# Patient Record
Sex: Male | Born: 1960
Health system: Southern US, Community
[De-identification: ages and names within clinical notes are randomized; demographics above are authoritative.]

## PROBLEM LIST (undated history)

## (undated) HISTORY — PX: COSMETIC SURGERY: SHX468

---

## 2001-05-23 ENCOUNTER — Ambulatory Visit (HOSPITAL_COMMUNITY): Admission: RE | Admit: 2001-05-23 | Discharge: 2001-05-23 | Payer: Self-pay | Admitting: General Surgery

## 2006-03-30 ENCOUNTER — Emergency Department (HOSPITAL_COMMUNITY): Admission: EM | Admit: 2006-03-30 | Discharge: 2006-03-31 | Payer: Self-pay | Admitting: Emergency Medicine

## 2006-04-02 ENCOUNTER — Ambulatory Visit (HOSPITAL_COMMUNITY): Admission: RE | Admit: 2006-04-02 | Discharge: 2006-04-02 | Payer: Self-pay | Admitting: *Deleted

## 2016-08-22 DIAGNOSIS — Z125 Encounter for screening for malignant neoplasm of prostate: Secondary | ICD-10-CM | POA: Diagnosis not present

## 2016-08-22 DIAGNOSIS — E7800 Pure hypercholesterolemia, unspecified: Secondary | ICD-10-CM | POA: Diagnosis not present

## 2016-08-22 DIAGNOSIS — Z0000 Encounter for general adult medical examination without abnormal findings: Secondary | ICD-10-CM | POA: Diagnosis not present

## 2016-08-22 DIAGNOSIS — Z79899 Other long term (current) drug therapy: Secondary | ICD-10-CM | POA: Diagnosis not present

## 2016-08-22 DIAGNOSIS — Z23 Encounter for immunization: Secondary | ICD-10-CM | POA: Diagnosis not present

## 2016-08-22 DIAGNOSIS — R5383 Other fatigue: Secondary | ICD-10-CM | POA: Diagnosis not present

## 2017-01-03 DIAGNOSIS — M9903 Segmental and somatic dysfunction of lumbar region: Secondary | ICD-10-CM | POA: Diagnosis not present

## 2017-01-07 DIAGNOSIS — M9903 Segmental and somatic dysfunction of lumbar region: Secondary | ICD-10-CM | POA: Diagnosis not present

## 2017-01-07 DIAGNOSIS — M461 Sacroiliitis, not elsewhere classified: Secondary | ICD-10-CM | POA: Diagnosis not present

## 2017-01-07 DIAGNOSIS — M5137 Other intervertebral disc degeneration, lumbosacral region: Secondary | ICD-10-CM | POA: Diagnosis not present

## 2017-01-07 DIAGNOSIS — M9905 Segmental and somatic dysfunction of pelvic region: Secondary | ICD-10-CM | POA: Diagnosis not present

## 2017-01-08 DIAGNOSIS — M9903 Segmental and somatic dysfunction of lumbar region: Secondary | ICD-10-CM | POA: Diagnosis not present

## 2017-01-08 DIAGNOSIS — M9905 Segmental and somatic dysfunction of pelvic region: Secondary | ICD-10-CM | POA: Diagnosis not present

## 2017-01-08 DIAGNOSIS — M461 Sacroiliitis, not elsewhere classified: Secondary | ICD-10-CM | POA: Diagnosis not present

## 2017-01-08 DIAGNOSIS — M5137 Other intervertebral disc degeneration, lumbosacral region: Secondary | ICD-10-CM | POA: Diagnosis not present

## 2017-01-10 DIAGNOSIS — M461 Sacroiliitis, not elsewhere classified: Secondary | ICD-10-CM | POA: Diagnosis not present

## 2017-01-10 DIAGNOSIS — M5137 Other intervertebral disc degeneration, lumbosacral region: Secondary | ICD-10-CM | POA: Diagnosis not present

## 2017-01-10 DIAGNOSIS — M9903 Segmental and somatic dysfunction of lumbar region: Secondary | ICD-10-CM | POA: Diagnosis not present

## 2017-01-10 DIAGNOSIS — M9905 Segmental and somatic dysfunction of pelvic region: Secondary | ICD-10-CM | POA: Diagnosis not present

## 2017-01-14 DIAGNOSIS — M461 Sacroiliitis, not elsewhere classified: Secondary | ICD-10-CM | POA: Diagnosis not present

## 2017-01-14 DIAGNOSIS — M5137 Other intervertebral disc degeneration, lumbosacral region: Secondary | ICD-10-CM | POA: Diagnosis not present

## 2017-01-14 DIAGNOSIS — M9903 Segmental and somatic dysfunction of lumbar region: Secondary | ICD-10-CM | POA: Diagnosis not present

## 2017-01-14 DIAGNOSIS — M9905 Segmental and somatic dysfunction of pelvic region: Secondary | ICD-10-CM | POA: Diagnosis not present

## 2017-01-16 DIAGNOSIS — M9903 Segmental and somatic dysfunction of lumbar region: Secondary | ICD-10-CM | POA: Diagnosis not present

## 2017-01-16 DIAGNOSIS — M9905 Segmental and somatic dysfunction of pelvic region: Secondary | ICD-10-CM | POA: Diagnosis not present

## 2017-01-16 DIAGNOSIS — M461 Sacroiliitis, not elsewhere classified: Secondary | ICD-10-CM | POA: Diagnosis not present

## 2017-01-16 DIAGNOSIS — M5137 Other intervertebral disc degeneration, lumbosacral region: Secondary | ICD-10-CM | POA: Diagnosis not present

## 2017-01-17 DIAGNOSIS — M461 Sacroiliitis, not elsewhere classified: Secondary | ICD-10-CM | POA: Diagnosis not present

## 2017-01-17 DIAGNOSIS — M5137 Other intervertebral disc degeneration, lumbosacral region: Secondary | ICD-10-CM | POA: Diagnosis not present

## 2017-01-17 DIAGNOSIS — M9903 Segmental and somatic dysfunction of lumbar region: Secondary | ICD-10-CM | POA: Diagnosis not present

## 2017-01-17 DIAGNOSIS — M9905 Segmental and somatic dysfunction of pelvic region: Secondary | ICD-10-CM | POA: Diagnosis not present

## 2017-01-22 DIAGNOSIS — M461 Sacroiliitis, not elsewhere classified: Secondary | ICD-10-CM | POA: Diagnosis not present

## 2017-01-22 DIAGNOSIS — M9903 Segmental and somatic dysfunction of lumbar region: Secondary | ICD-10-CM | POA: Diagnosis not present

## 2017-01-22 DIAGNOSIS — M5137 Other intervertebral disc degeneration, lumbosacral region: Secondary | ICD-10-CM | POA: Diagnosis not present

## 2017-01-22 DIAGNOSIS — M9905 Segmental and somatic dysfunction of pelvic region: Secondary | ICD-10-CM | POA: Diagnosis not present

## 2017-01-24 DIAGNOSIS — M9905 Segmental and somatic dysfunction of pelvic region: Secondary | ICD-10-CM | POA: Diagnosis not present

## 2017-01-24 DIAGNOSIS — M5137 Other intervertebral disc degeneration, lumbosacral region: Secondary | ICD-10-CM | POA: Diagnosis not present

## 2017-01-24 DIAGNOSIS — M461 Sacroiliitis, not elsewhere classified: Secondary | ICD-10-CM | POA: Diagnosis not present

## 2017-01-24 DIAGNOSIS — M9903 Segmental and somatic dysfunction of lumbar region: Secondary | ICD-10-CM | POA: Diagnosis not present

## 2017-01-29 DIAGNOSIS — M9905 Segmental and somatic dysfunction of pelvic region: Secondary | ICD-10-CM | POA: Diagnosis not present

## 2017-01-29 DIAGNOSIS — M9903 Segmental and somatic dysfunction of lumbar region: Secondary | ICD-10-CM | POA: Diagnosis not present

## 2017-01-29 DIAGNOSIS — M5137 Other intervertebral disc degeneration, lumbosacral region: Secondary | ICD-10-CM | POA: Diagnosis not present

## 2017-01-29 DIAGNOSIS — M461 Sacroiliitis, not elsewhere classified: Secondary | ICD-10-CM | POA: Diagnosis not present

## 2017-01-31 DIAGNOSIS — M5137 Other intervertebral disc degeneration, lumbosacral region: Secondary | ICD-10-CM | POA: Diagnosis not present

## 2017-01-31 DIAGNOSIS — M9905 Segmental and somatic dysfunction of pelvic region: Secondary | ICD-10-CM | POA: Diagnosis not present

## 2017-01-31 DIAGNOSIS — M461 Sacroiliitis, not elsewhere classified: Secondary | ICD-10-CM | POA: Diagnosis not present

## 2017-01-31 DIAGNOSIS — M9903 Segmental and somatic dysfunction of lumbar region: Secondary | ICD-10-CM | POA: Diagnosis not present

## 2017-02-05 DIAGNOSIS — M9905 Segmental and somatic dysfunction of pelvic region: Secondary | ICD-10-CM | POA: Diagnosis not present

## 2017-02-05 DIAGNOSIS — M9903 Segmental and somatic dysfunction of lumbar region: Secondary | ICD-10-CM | POA: Diagnosis not present

## 2017-02-05 DIAGNOSIS — M5137 Other intervertebral disc degeneration, lumbosacral region: Secondary | ICD-10-CM | POA: Diagnosis not present

## 2017-02-05 DIAGNOSIS — M461 Sacroiliitis, not elsewhere classified: Secondary | ICD-10-CM | POA: Diagnosis not present

## 2017-02-07 DIAGNOSIS — M9903 Segmental and somatic dysfunction of lumbar region: Secondary | ICD-10-CM | POA: Diagnosis not present

## 2017-02-07 DIAGNOSIS — M461 Sacroiliitis, not elsewhere classified: Secondary | ICD-10-CM | POA: Diagnosis not present

## 2017-02-07 DIAGNOSIS — M5137 Other intervertebral disc degeneration, lumbosacral region: Secondary | ICD-10-CM | POA: Diagnosis not present

## 2017-02-07 DIAGNOSIS — M9905 Segmental and somatic dysfunction of pelvic region: Secondary | ICD-10-CM | POA: Diagnosis not present

## 2017-02-12 DIAGNOSIS — M9903 Segmental and somatic dysfunction of lumbar region: Secondary | ICD-10-CM | POA: Diagnosis not present

## 2017-02-12 DIAGNOSIS — M461 Sacroiliitis, not elsewhere classified: Secondary | ICD-10-CM | POA: Diagnosis not present

## 2017-02-12 DIAGNOSIS — M5137 Other intervertebral disc degeneration, lumbosacral region: Secondary | ICD-10-CM | POA: Diagnosis not present

## 2017-02-12 DIAGNOSIS — M9905 Segmental and somatic dysfunction of pelvic region: Secondary | ICD-10-CM | POA: Diagnosis not present

## 2017-02-14 DIAGNOSIS — M461 Sacroiliitis, not elsewhere classified: Secondary | ICD-10-CM | POA: Diagnosis not present

## 2017-02-14 DIAGNOSIS — M9905 Segmental and somatic dysfunction of pelvic region: Secondary | ICD-10-CM | POA: Diagnosis not present

## 2017-02-14 DIAGNOSIS — M9903 Segmental and somatic dysfunction of lumbar region: Secondary | ICD-10-CM | POA: Diagnosis not present

## 2017-02-14 DIAGNOSIS — M5137 Other intervertebral disc degeneration, lumbosacral region: Secondary | ICD-10-CM | POA: Diagnosis not present

## 2017-02-19 DIAGNOSIS — M9903 Segmental and somatic dysfunction of lumbar region: Secondary | ICD-10-CM | POA: Diagnosis not present

## 2017-02-19 DIAGNOSIS — M461 Sacroiliitis, not elsewhere classified: Secondary | ICD-10-CM | POA: Diagnosis not present

## 2017-02-19 DIAGNOSIS — M9905 Segmental and somatic dysfunction of pelvic region: Secondary | ICD-10-CM | POA: Diagnosis not present

## 2017-02-19 DIAGNOSIS — M5137 Other intervertebral disc degeneration, lumbosacral region: Secondary | ICD-10-CM | POA: Diagnosis not present

## 2017-02-21 DIAGNOSIS — M9903 Segmental and somatic dysfunction of lumbar region: Secondary | ICD-10-CM | POA: Diagnosis not present

## 2017-02-21 DIAGNOSIS — M9905 Segmental and somatic dysfunction of pelvic region: Secondary | ICD-10-CM | POA: Diagnosis not present

## 2017-02-21 DIAGNOSIS — M5137 Other intervertebral disc degeneration, lumbosacral region: Secondary | ICD-10-CM | POA: Diagnosis not present

## 2017-02-21 DIAGNOSIS — M461 Sacroiliitis, not elsewhere classified: Secondary | ICD-10-CM | POA: Diagnosis not present

## 2017-02-26 DIAGNOSIS — M5137 Other intervertebral disc degeneration, lumbosacral region: Secondary | ICD-10-CM | POA: Diagnosis not present

## 2017-02-26 DIAGNOSIS — M9903 Segmental and somatic dysfunction of lumbar region: Secondary | ICD-10-CM | POA: Diagnosis not present

## 2017-02-26 DIAGNOSIS — M9905 Segmental and somatic dysfunction of pelvic region: Secondary | ICD-10-CM | POA: Diagnosis not present

## 2017-02-26 DIAGNOSIS — M461 Sacroiliitis, not elsewhere classified: Secondary | ICD-10-CM | POA: Diagnosis not present

## 2017-03-05 DIAGNOSIS — M9903 Segmental and somatic dysfunction of lumbar region: Secondary | ICD-10-CM | POA: Diagnosis not present

## 2017-03-05 DIAGNOSIS — M9905 Segmental and somatic dysfunction of pelvic region: Secondary | ICD-10-CM | POA: Diagnosis not present

## 2017-03-05 DIAGNOSIS — M461 Sacroiliitis, not elsewhere classified: Secondary | ICD-10-CM | POA: Diagnosis not present

## 2017-03-05 DIAGNOSIS — M5137 Other intervertebral disc degeneration, lumbosacral region: Secondary | ICD-10-CM | POA: Diagnosis not present

## 2017-03-12 DIAGNOSIS — M9905 Segmental and somatic dysfunction of pelvic region: Secondary | ICD-10-CM | POA: Diagnosis not present

## 2017-03-12 DIAGNOSIS — M5137 Other intervertebral disc degeneration, lumbosacral region: Secondary | ICD-10-CM | POA: Diagnosis not present

## 2017-03-12 DIAGNOSIS — M9903 Segmental and somatic dysfunction of lumbar region: Secondary | ICD-10-CM | POA: Diagnosis not present

## 2017-03-12 DIAGNOSIS — M461 Sacroiliitis, not elsewhere classified: Secondary | ICD-10-CM | POA: Diagnosis not present

## 2017-03-25 DIAGNOSIS — N501 Vascular disorders of male genital organs: Secondary | ICD-10-CM | POA: Diagnosis not present

## 2017-03-25 DIAGNOSIS — N486 Induration penis plastica: Secondary | ICD-10-CM | POA: Diagnosis not present

## 2017-03-26 DIAGNOSIS — M5137 Other intervertebral disc degeneration, lumbosacral region: Secondary | ICD-10-CM | POA: Diagnosis not present

## 2017-03-26 DIAGNOSIS — M461 Sacroiliitis, not elsewhere classified: Secondary | ICD-10-CM | POA: Diagnosis not present

## 2017-03-26 DIAGNOSIS — M9903 Segmental and somatic dysfunction of lumbar region: Secondary | ICD-10-CM | POA: Diagnosis not present

## 2017-03-26 DIAGNOSIS — M9905 Segmental and somatic dysfunction of pelvic region: Secondary | ICD-10-CM | POA: Diagnosis not present

## 2017-09-12 DIAGNOSIS — Z13220 Encounter for screening for lipoid disorders: Secondary | ICD-10-CM | POA: Diagnosis not present

## 2017-09-12 DIAGNOSIS — Z0000 Encounter for general adult medical examination without abnormal findings: Secondary | ICD-10-CM | POA: Diagnosis not present

## 2017-09-12 DIAGNOSIS — Z131 Encounter for screening for diabetes mellitus: Secondary | ICD-10-CM | POA: Diagnosis not present

## 2017-09-12 DIAGNOSIS — Z125 Encounter for screening for malignant neoplasm of prostate: Secondary | ICD-10-CM | POA: Diagnosis not present

## 2017-09-12 DIAGNOSIS — Z23 Encounter for immunization: Secondary | ICD-10-CM | POA: Diagnosis not present

## 2017-09-27 DIAGNOSIS — R7301 Impaired fasting glucose: Secondary | ICD-10-CM | POA: Diagnosis not present

## 2017-11-21 DIAGNOSIS — M545 Low back pain: Secondary | ICD-10-CM | POA: Diagnosis not present

## 2018-10-06 DIAGNOSIS — Z125 Encounter for screening for malignant neoplasm of prostate: Secondary | ICD-10-CM | POA: Diagnosis not present

## 2018-10-06 DIAGNOSIS — Z23 Encounter for immunization: Secondary | ICD-10-CM | POA: Diagnosis not present

## 2018-10-06 DIAGNOSIS — Z13220 Encounter for screening for lipoid disorders: Secondary | ICD-10-CM | POA: Diagnosis not present

## 2018-10-06 DIAGNOSIS — Z131 Encounter for screening for diabetes mellitus: Secondary | ICD-10-CM | POA: Diagnosis not present

## 2018-10-06 DIAGNOSIS — Z0000 Encounter for general adult medical examination without abnormal findings: Secondary | ICD-10-CM | POA: Diagnosis not present

## 2018-11-19 DIAGNOSIS — J019 Acute sinusitis, unspecified: Secondary | ICD-10-CM | POA: Diagnosis not present

## 2019-10-21 DIAGNOSIS — Z13220 Encounter for screening for lipoid disorders: Secondary | ICD-10-CM | POA: Diagnosis not present

## 2019-10-21 DIAGNOSIS — Z125 Encounter for screening for malignant neoplasm of prostate: Secondary | ICD-10-CM | POA: Diagnosis not present

## 2019-10-21 DIAGNOSIS — Z0000 Encounter for general adult medical examination without abnormal findings: Secondary | ICD-10-CM | POA: Diagnosis not present

## 2019-10-23 DIAGNOSIS — Z0000 Encounter for general adult medical examination without abnormal findings: Secondary | ICD-10-CM | POA: Diagnosis not present

## 2020-05-06 ENCOUNTER — Emergency Department (HOSPITAL_COMMUNITY): Payer: BC Managed Care – PPO

## 2020-05-06 ENCOUNTER — Other Ambulatory Visit: Payer: Self-pay

## 2020-05-06 ENCOUNTER — Emergency Department (HOSPITAL_COMMUNITY)
Admission: EM | Admit: 2020-05-06 | Discharge: 2020-05-06 | Disposition: A | Discharge status: Home / Self Care | Payer: BC Managed Care – PPO | Attending: Emergency Medicine | Admitting: Emergency Medicine

## 2020-05-06 ENCOUNTER — Encounter (HOSPITAL_COMMUNITY): Payer: Self-pay

## 2020-05-06 DIAGNOSIS — R110 Nausea: Secondary | ICD-10-CM | POA: Diagnosis not present

## 2020-05-06 DIAGNOSIS — N289 Disorder of kidney and ureter, unspecified: Secondary | ICD-10-CM | POA: Diagnosis not present

## 2020-05-06 DIAGNOSIS — R1011 Right upper quadrant pain: Secondary | ICD-10-CM | POA: Insufficient documentation

## 2020-05-06 LAB — CBC
HCT: 45.6 % (ref 39.0–52.0)
Hemoglobin: 15.4 g/dL (ref 13.0–17.0)
MCH: 32.3 pg (ref 26.0–34.0)
MCHC: 33.8 g/dL (ref 30.0–36.0)
MCV: 95.6 fL (ref 80.0–100.0)
Platelets: 189 10*3/uL (ref 150–400)
RBC: 4.77 MIL/uL (ref 4.22–5.81)
RDW: 12 % (ref 11.5–15.5)
WBC: 8.7 10*3/uL (ref 4.0–10.5)
nRBC: 0 % (ref 0.0–0.2)

## 2020-05-06 LAB — COMPREHENSIVE METABOLIC PANEL
ALT: 23 U/L (ref 0–44)
AST: 23 U/L (ref 15–41)
Albumin: 4.1 g/dL (ref 3.5–5.0)
Alkaline Phosphatase: 56 U/L (ref 38–126)
Anion gap: 10 (ref 5–15)
BUN: 29 mg/dL — ABNORMAL HIGH (ref 6–20)
CO2: 26 mmol/L (ref 22–32)
Calcium: 9.2 mg/dL (ref 8.9–10.3)
Chloride: 104 mmol/L (ref 98–111)
Creatinine, Ser: 1.54 mg/dL — ABNORMAL HIGH (ref 0.61–1.24)
GFR calc Af Amer: 57 mL/min — ABNORMAL LOW (ref >60)
GFR calc non Af Amer: 49 mL/min — ABNORMAL LOW (ref >60)
Glucose, Bld: 117 mg/dL — ABNORMAL HIGH (ref 70–99)
Potassium: 4.2 mmol/L (ref 3.5–5.1)
Sodium: 140 mmol/L (ref 135–145)
Total Bilirubin: 0.7 mg/dL (ref 0.3–1.2)
Total Protein: 6.6 g/dL (ref 6.5–8.1)

## 2020-05-06 LAB — URINALYSIS, ROUTINE W REFLEX MICROSCOPIC
Bacteria, UA: NONE SEEN
Bilirubin Urine: NEGATIVE
Glucose, UA: NEGATIVE mg/dL
Ketones, ur: NEGATIVE mg/dL
Leukocytes,Ua: NEGATIVE
Nitrite: NEGATIVE
Protein, ur: NEGATIVE mg/dL
Specific Gravity, Urine: 1.019 (ref 1.005–1.030)
pH: 6 (ref 5.0–8.0)

## 2020-05-06 LAB — LIPASE, BLOOD: Lipase: 39 U/L (ref 11–51)

## 2020-05-06 MED ORDER — FENTANYL CITRATE (PF) 100 MCG/2ML IJ SOLN
50.0000 ug | Freq: Once | INTRAMUSCULAR | Status: AC
  Administered 2020-05-06: 50 ug via INTRAVENOUS
  Filled 2020-05-06: qty 2

## 2020-05-06 MED ORDER — LACTATED RINGERS IV BOLUS: 1000.0000 mL | Freq: Once | INTRAVENOUS | Status: DC

## 2020-05-06 MED ORDER — LACTATED RINGERS IV BOLUS
1000.0000 mL | Freq: Once | INTRAVENOUS | Status: AC
  Administered 2020-05-06: 1000 mL via INTRAVENOUS

## 2020-05-06 MED ORDER — SODIUM CHLORIDE 0.9% FLUSH: 3.0000 mL | Freq: Once | INTRAVENOUS | Status: AC

## 2020-05-06 MED ORDER — SODIUM CHLORIDE (PF) 0.9 % IJ SOLN
INTRAMUSCULAR | Status: AC
  Administered 2020-05-06: 3 mL via INTRAVENOUS
  Filled 2020-05-06: qty 50

## 2020-05-06 MED ORDER — ONDANSETRON HCL 4 MG/2ML IJ SOLN
4.0000 mg | Freq: Once | INTRAMUSCULAR | Status: AC
  Administered 2020-05-06: 4 mg via INTRAVENOUS
  Filled 2020-05-06: qty 2

## 2020-05-06 MED ORDER — IOHEXOL 300 MG/ML  SOLN
100.0000 mL | Freq: Once | INTRAMUSCULAR | Status: AC | PRN
  Administered 2020-05-06: 80 mL via INTRAVENOUS

## 2020-05-06 NOTE — ED Provider Notes (Signed)
  Physical Exam  BP 125/72   Pulse (!) 56   Temp 98.4 F (36.9 C) (Oral)   Resp 18   Ht 5\' 9"  (1.753 m)   Wt 69.4 kg   SpO2 99%   BMI 22.59 kg/m   Physical Exam  ED Course/Procedures     Procedures  MDM   Received patient in signout.  Abdominal pain.  Labs and CT reassuring.  Ultrasound done and also reassuring.  Lab work.    Patient does feel better.  Mild renal insufficiency to follow as an outpatient.  Discharge home.      , MD 05/06/20 [phone]

## 2020-05-06 NOTE — ED Provider Notes (Signed)
Emergency Department Provider Note  I have reviewed the triage vital signs and the nursing notes.  HISTORY  Chief Complaint Abdominal Pain (RUQ)   HPI Justin Tapia is a 59 y.o. male without any significant past medical history not on any daily medicines who presents to the emergency department today secondary to abdominal pain.  Patient states that he has had some right upper quadrant abdominal pain ever since about 11:00 on morning of June 3  it has progressively worsened since that time.  He has had some nausea intermittently.  He states this was about an hour and a half after he ate eggs sandwich.  No known problems with his gallbladder in the past.  No history of kidney stones.  No history of abdominal surgeries.  States is very colicky in nature.  Severe at its worst but near 0 when it is not.   No other associated or modifying symptoms.    History reviewed. No pertinent past medical history.  There are no problems to display for this patient.   Allergies Patient has no known allergies.  No family history on file.  Social History Social History   Tobacco Use  . Smoking status: Never Smoker  Substance Use Topics  . Alcohol use: Yes  . Drug use: Never    Review of Systems  All other systems negative except as documented in the HPI. All pertinent positives and negatives as reviewed in the HPI. ____________________________________________  PHYSICAL EXAM:  VITAL SIGNS: ED Triage Vitals  Enc Vitals Group     BP 05/06/20 0013 128/74     Pulse Rate 05/06/20 0013 76     Resp 05/06/20 0013 18     Temp 05/06/20 0013 98.3 F (36.8 C)     Temp Source 05/06/20 0013 Oral     SpO2 05/06/20 0013 99 %     Weight 05/06/20 0013 153 lb (69.4 kg)     Height 05/06/20 0013 5\' 9"  (1.753 m)    Constitutional: Alert and oriented. Well appearing and in no acute distress. Eyes: Conjunctivae are normal. PERRL. EOMI. Head: Atraumatic. Nose: No  congestion/rhinnorhea. Mouth/Throat: Mucous membranes are moist.  Oropharynx non-erythematous. Neck: No stridor.  No meningeal signs.   Cardiovascular: Normal rate, regular rhythm. Good peripheral circulation. Grossly normal heart sounds.   Respiratory: Normal respiratory effort.  No retractions. Lungs CTAB. Gastrointestinal: Soft and nontender. No distention. No rash Musculoskeletal: No lower extremity tenderness nor edema. No gross deformities of extremities. Neurologic:  Normal speech and language. No gross focal neurologic deficits are appreciated.  Skin:  Skin is warm, dry and intact. No rash noted.  ____________________________________________   LABS (all labs ordered are listed, but only abnormal results are displayed)  Labs Reviewed  COMPREHENSIVE METABOLIC PANEL - Abnormal; Notable for the following components:      Result Value   Glucose, Bld 117 (*)    BUN 29 (*)    Creatinine, Ser 1.54 (*)    GFR calc non Af Amer 49 (*)    GFR calc Af Amer 57 (*)    All other components within normal limits  LIPASE, BLOOD  CBC  URINALYSIS, ROUTINE W REFLEX MICROSCOPIC   ____________________________________________  RADIOLOGY  CT ABDOMEN PELVIS W CONTRAST  Result Date: 05/06/2020 CLINICAL DATA:  Right upper quadrant pain EXAM: CT ABDOMEN AND PELVIS WITH CONTRAST TECHNIQUE: Multidetector CT imaging of the abdomen and pelvis was performed using the standard protocol following bolus administration of intravenous contrast. CONTRAST:  63mL OMNIPAQUE  IOHEXOL 300 MG/ML  SOLN COMPARISON:  None. FINDINGS: Lower chest: The visualized heart size within normal limits. No pericardial fluid/thickening. No hiatal hernia. The visualized portions of the lungs are clear. Hepatobiliary: The liver is normal in density without focal abnormality.The main portal vein is patent. No evidence of calcified gallstones, gallbladder wall thickening or biliary dilatation. Pancreas: Unremarkable. No pancreatic ductal  dilatation or surrounding inflammatory changes. Spleen: Normal in size without focal abnormality. Adrenals/Urinary Tract: Both adrenal glands appear normal. The kidneys and collecting system appear normal without evidence of urinary tract calculus or hydronephrosis. Bladder is unremarkable. Stomach/Bowel: The stomach, small bowel, and colon are normal in appearance. No inflammatory changes, wall thickening, or obstructive findings.The appendix is normal. Vascular/Lymphatic: There are no enlarged mesenteric, retroperitoneal, or pelvic lymph nodes. Mild scattered aortic atherosclerosis seen. Reproductive: The prostate is unremarkable. Other: No evidence of abdominal wall mass or hernia. Musculoskeletal: No acute or significant osseous findings. IMPRESSION: No acute intra-abdominal or pelvic pathology to explain the patient's symptoms. Aortic Atherosclerosis (ICD10-I70.0). Electronically Signed   By: Jonna Clark M.D.   On: 05/06/2020 06:38   ____________________________________________  PROCEDURES  Procedure(s) performed:   Procedures ____________________________________________  INITIAL IMPRESSION / ASSESSMENT AND PLAN / ED COURSE   This patient presents to the ED for concern of right-sided abdominal pain, this involves an extensive number of treatment options, and is a complaint that carries with it a high risk of complications and morbidity.  The differential diagnosis includes cholecystitis, nephrolithiasis, appendicitis, colitis, gastroenteritis versus musculoskeletal.     Lab Tests:   I Ordered, reviewed, and interpreted labs, which included CBC, CMP lipase and urinalysis all of which were relatively unremarkable aside from mildly elevated creatinine.  Medicines ordered:   I ordered medication fentanyl, bolus and Zofran for abdominal pain and nausea.  Imaging Studies ordered:   I independently visualized and interpreted imaging CT abdomen pelvis which showed appeared to have some  gallbladder wall thickening in surrounding fluid but read negative by radiologist so we will add on ultrasound to evaluate further.  Additional history obtained:   Additional history obtained from noone  Consultations Obtained:   None indicated  Reevaluation:  After the interventions stated above, I reevaluated the patient and found significantly improved symptoms. Will add on [place] to ensure no GB pathology. Care transferred pending same.   ____________________________________________  FINAL CLINICAL IMPRESSION(S) / ED DIAGNOSES  Final diagnoses:  RUQ pain    MEDICATIONS GIVEN DURING THIS VISIT:  Medications  sodium chloride flush (NS) 0.9 % injection 3 mL (has no administration in time range)  lactated ringers bolus 1,000 mL (has no administration in time range)  lactated ringers bolus 1,000 mL (1,000 mLs Intravenous New Bag/Given 05/06/20 0453)  fentaNYL (SUBLIMAZE) injection 50 mcg (50 mcg Intravenous Given 05/06/20 0453)  ondansetron (ZOFRAN) injection 4 mg (4 mg Intravenous Given 05/06/20 0454)  iohexol (OMNIPAQUE) 300 MG/ML solution 100 mL (80 mLs Intravenous Contrast Given 05/06/20 0622)    NEW OUTPATIENT MEDICATIONS STARTED DURING THIS VISIT:  New Prescriptions   No medications on file    Note:  This note was prepared with assistance of Dragon voice recognition software. Occasional wrong-word or sound-a-like substitutions may have occurred due to the inherent limitations of voice recognition software.   Marily Memos, MD 05/06/20 [phone]

## 2020-05-06 NOTE — ED Notes (Signed)
Requested patient to urinate. Patient given urinal.

## 2020-05-06 NOTE — ED Triage Notes (Signed)
Arrived POV from home. Patient reports RUQ abdominal pain since around 11AM. Patient also reports urinary urgency and pain when urinating.

## 2020-05-11 ENCOUNTER — Other Ambulatory Visit: Payer: Self-pay | Admitting: Family Medicine

## 2020-05-11 ENCOUNTER — Ambulatory Visit
Admission: RE | Admit: 2020-05-11 | Discharge: 2020-05-11 | Disposition: A | Discharge status: Home / Self Care | Payer: BC Managed Care – PPO | Source: Ambulatory Visit | Attending: Family Medicine | Admitting: Family Medicine

## 2020-05-11 DIAGNOSIS — R1011 Right upper quadrant pain: Secondary | ICD-10-CM | POA: Diagnosis not present

## 2020-05-11 DIAGNOSIS — N179 Acute kidney failure, unspecified: Secondary | ICD-10-CM | POA: Diagnosis not present

## 2020-05-11 DIAGNOSIS — N200 Calculus of kidney: Secondary | ICD-10-CM

## 2020-05-11 DIAGNOSIS — R109 Unspecified abdominal pain: Secondary | ICD-10-CM | POA: Diagnosis not present

## 2020-10-06 DIAGNOSIS — M25562 Pain in left knee: Secondary | ICD-10-CM | POA: Diagnosis not present

## 2020-11-16 DIAGNOSIS — Z125 Encounter for screening for malignant neoplasm of prostate: Secondary | ICD-10-CM | POA: Diagnosis not present

## 2020-11-16 DIAGNOSIS — Z0000 Encounter for general adult medical examination without abnormal findings: Secondary | ICD-10-CM | POA: Diagnosis not present

## 2020-11-16 DIAGNOSIS — Z13220 Encounter for screening for lipoid disorders: Secondary | ICD-10-CM | POA: Diagnosis not present

## 2020-11-16 DIAGNOSIS — Z131 Encounter for screening for diabetes mellitus: Secondary | ICD-10-CM | POA: Diagnosis not present

## 2020-11-23 DIAGNOSIS — M25562 Pain in left knee: Secondary | ICD-10-CM | POA: Diagnosis not present

## 2020-11-25 IMAGING — US US ABDOMEN LIMITED
1 series · 14 of 25 positions shown · non-contrast
Comparison: Abdominal CT from earlier today

CLINICAL DATA: Right upper quadrant pain

EXAM:
ULTRASOUND ABDOMEN LIMITED RIGHT UPPER QUADRANT

[Series 1: us abdomen limited · 14 of 54 slices shown]
[im 1/54]
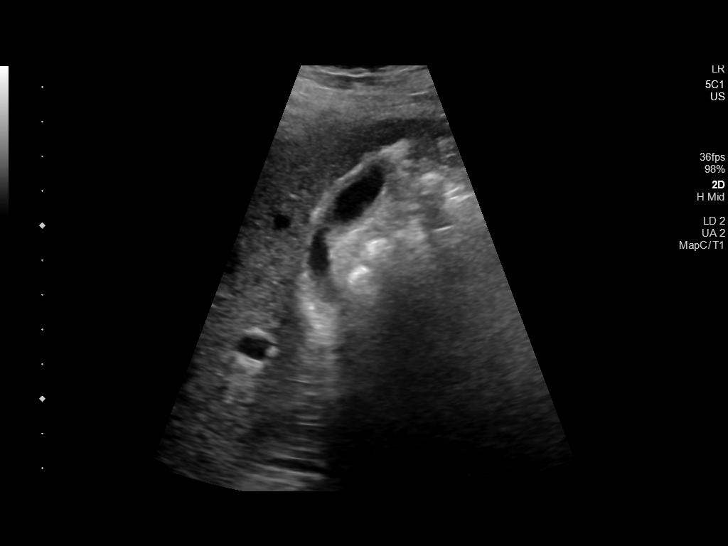
[im 5/54]
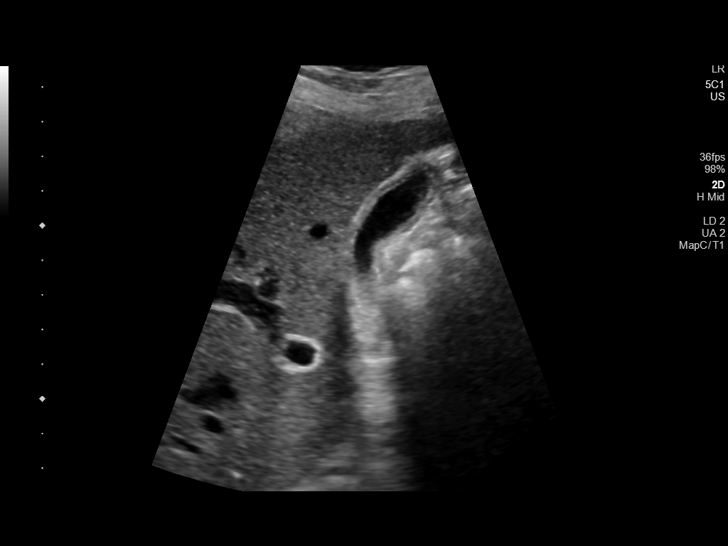
[im 9/54]
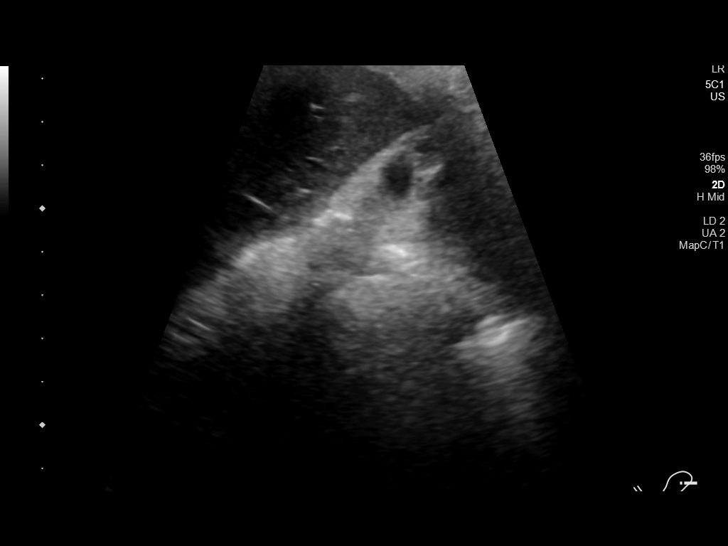
[im 14/54]
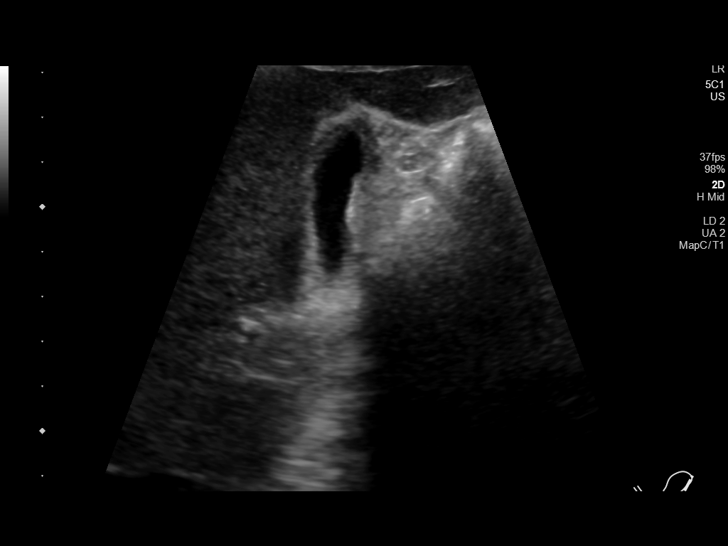
[im 18/54]
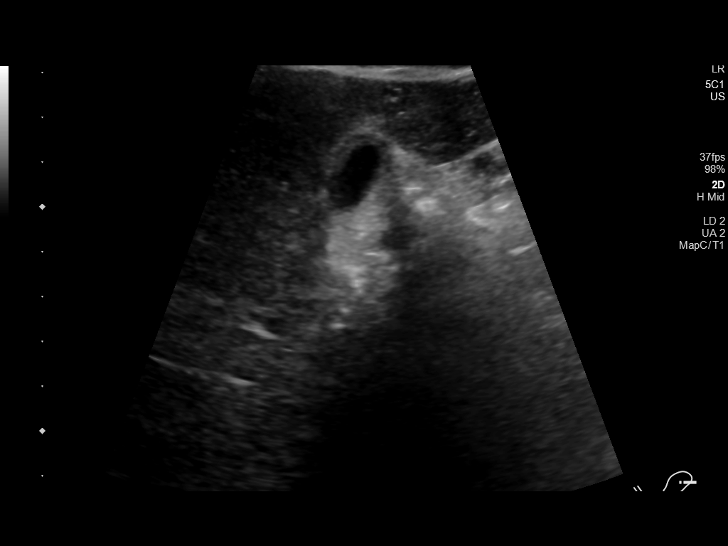
[im 20/54]
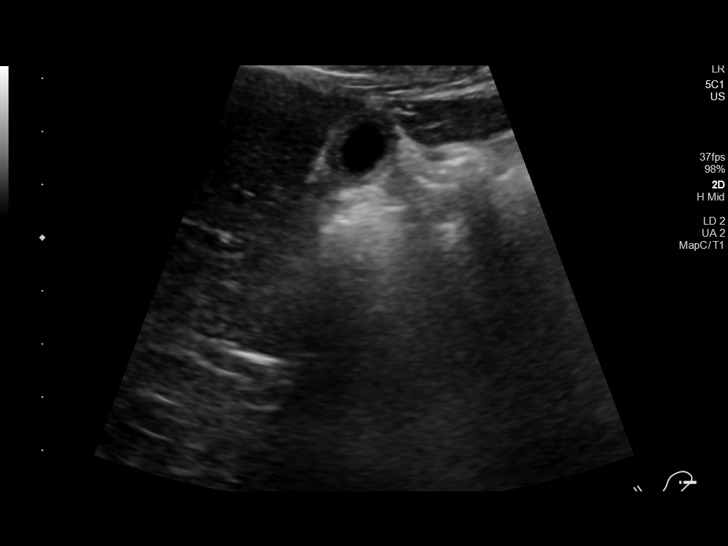
[im 25/54]
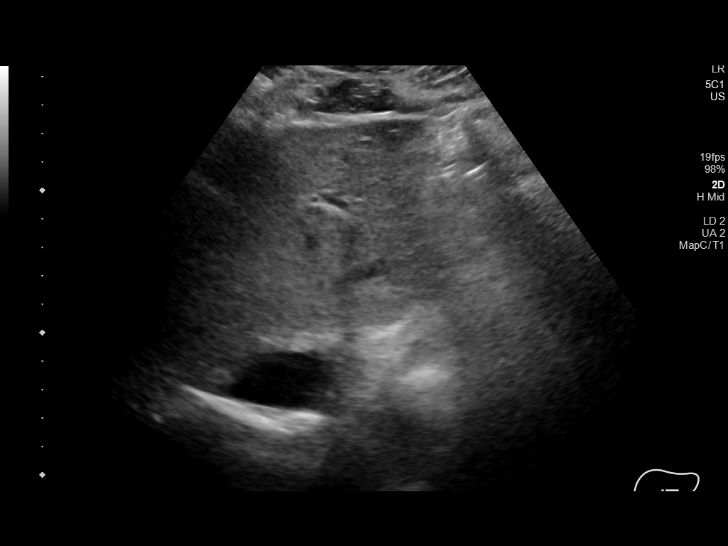
[im 29/54]
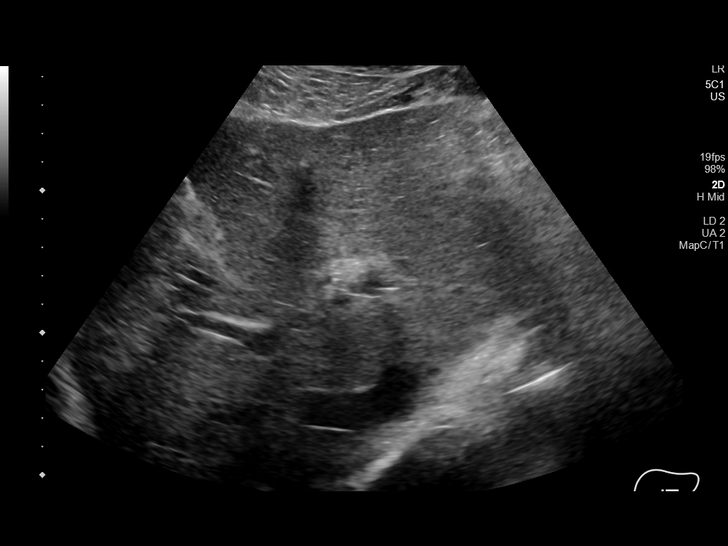
[im 34/54]
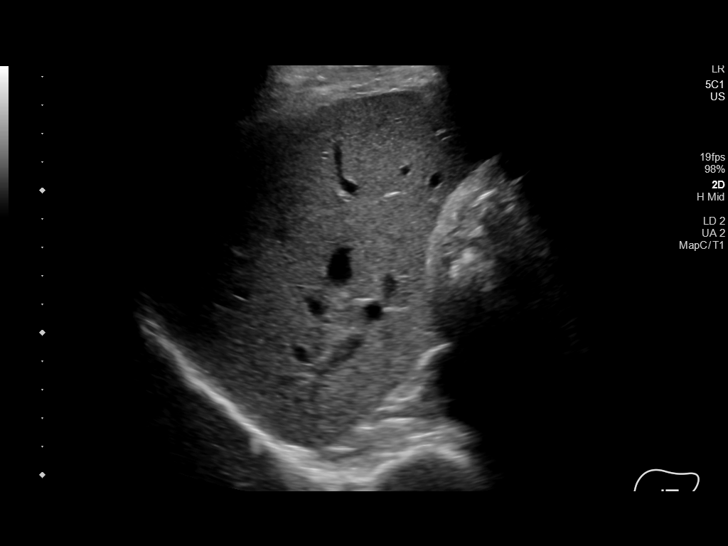
[im 36/54]
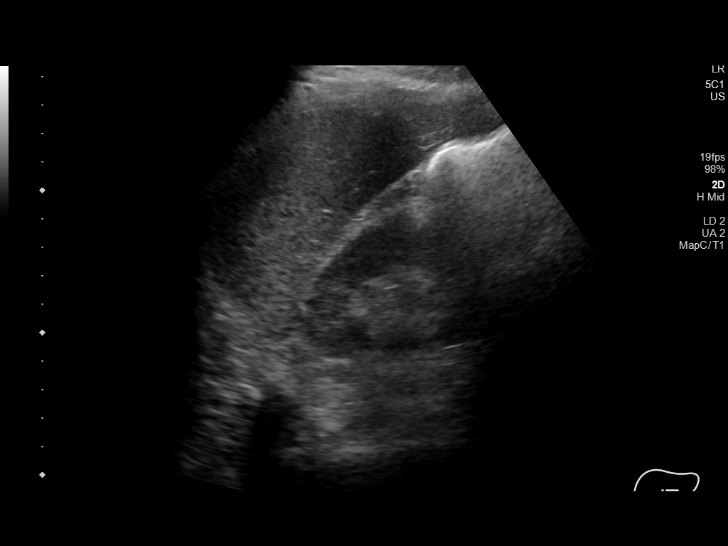
[im 40/54]
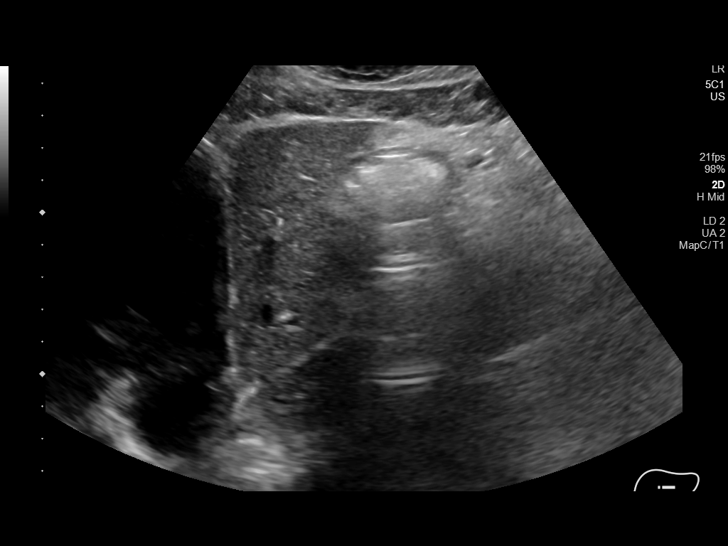
[im 45/54]
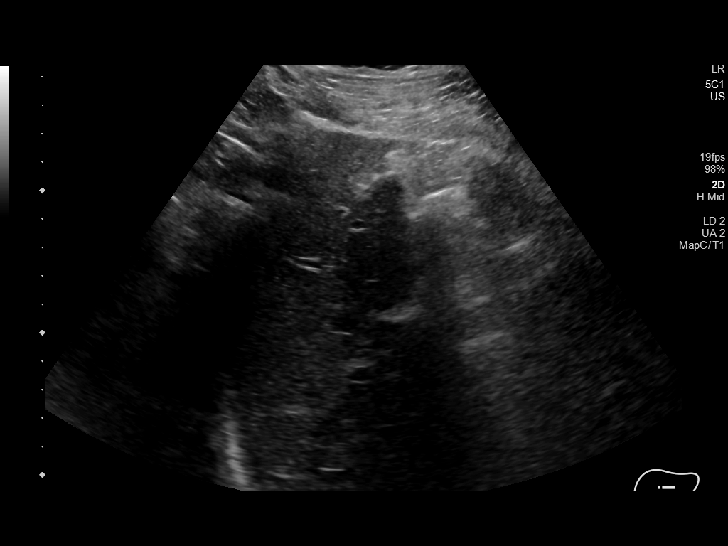
[im 49/54]
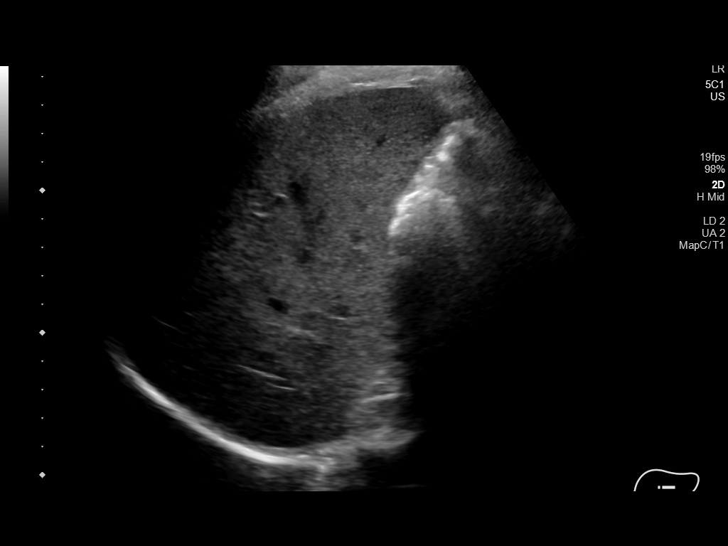
[im 54/54]
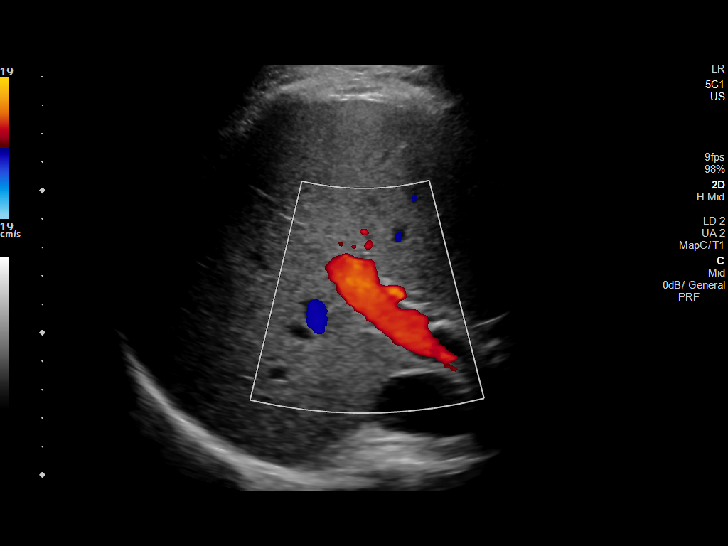

[14 of 25 positions shown; findings below may reference images not displayed]

FINDINGS: Gallbladder:

No gallstones or wall thickening visualized. No sonographic Murphy
sign noted by sonographer.

Common bile duct:

Diameter: 3 mm.

Liver:

No focal lesion identified. Within normal limits in parenchymal
echogenicity. Portal vein is patent on color Doppler imaging with
normal direction of blood flow towards the liver.
IMPRESSION: Negative right upper quadrant ultrasound.

## 2020-11-25 IMAGING — CT CT ABD-PELV W/ CM
2 of 5 series · 16 of 46 positions shown, 18 images · IV contrast (OMNIPAQUE 300)
Comparison: None.

CLINICAL DATA: Right upper quadrant pain

EXAM:
CT ABDOMEN AND PELVIS WITH CONTRAST
TECHNIQUE: Multidetector CT imaging of the abdomen and pelvis was performed
using the standard protocol following bolus administration of
intravenous contrast.
CONTRAST:  80mL OMNIPAQUE IOHEXOL 300 MG/ML  SOLN

[Series 2: axial st · axial · 0.68mm/px · z∈[+1072,+1468]mm · 13 of 91 slices shown, 15 images]
[im 6/91  soft-tissue]
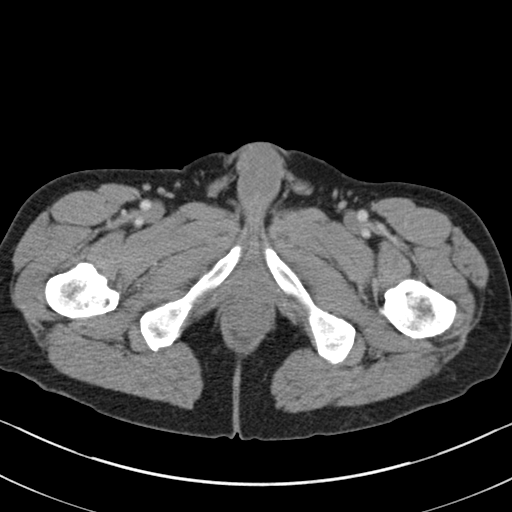
[im 6/91  bone]
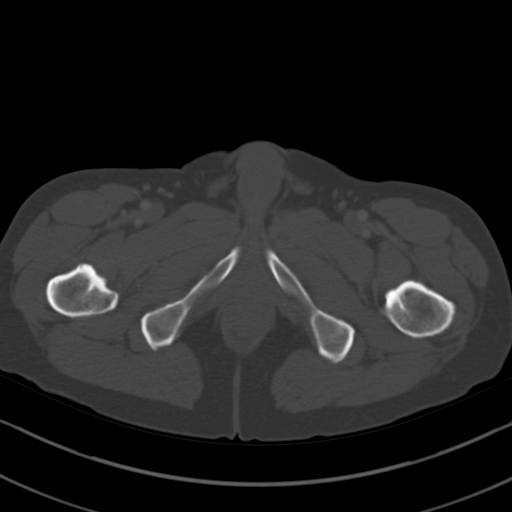
[im 11/91  soft-tissue]
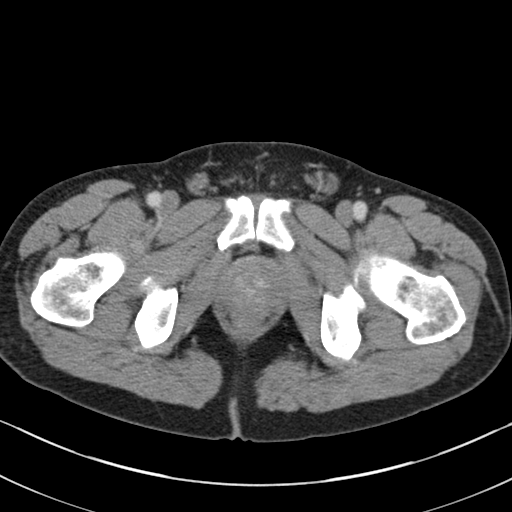
[im 22/91  soft-tissue]
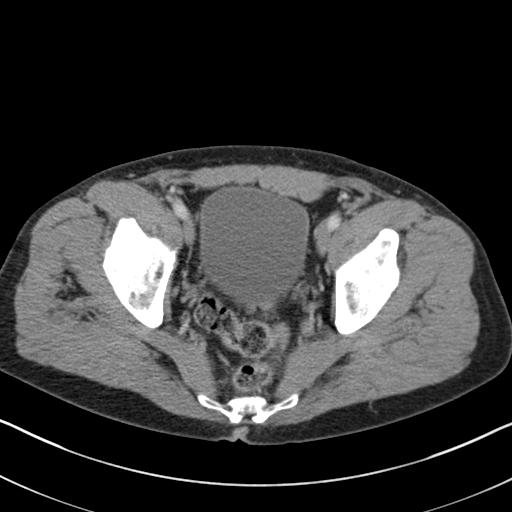
[im 27/91  soft-tissue]
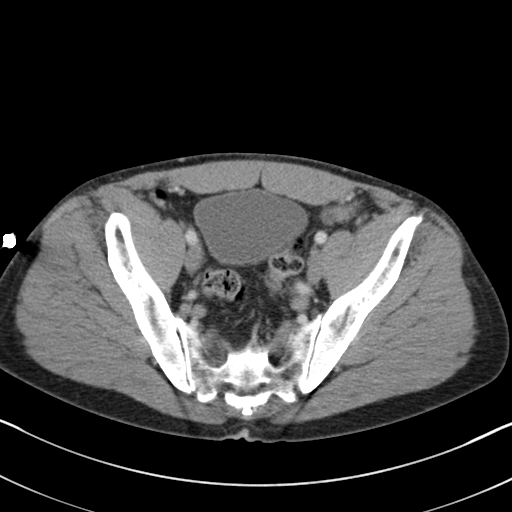
[im 32/91  soft-tissue]
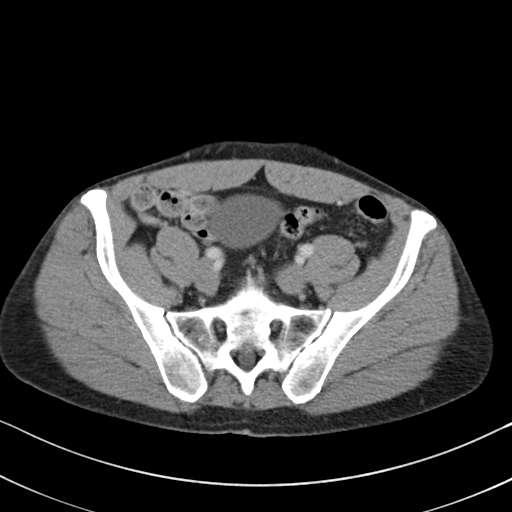
[im 38/91  soft-tissue]
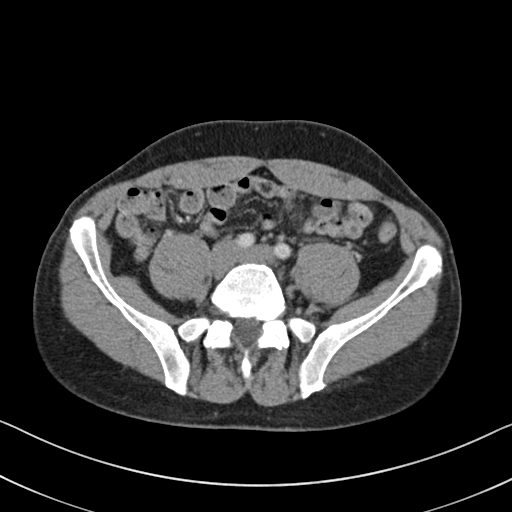
[im 48/91  soft-tissue]
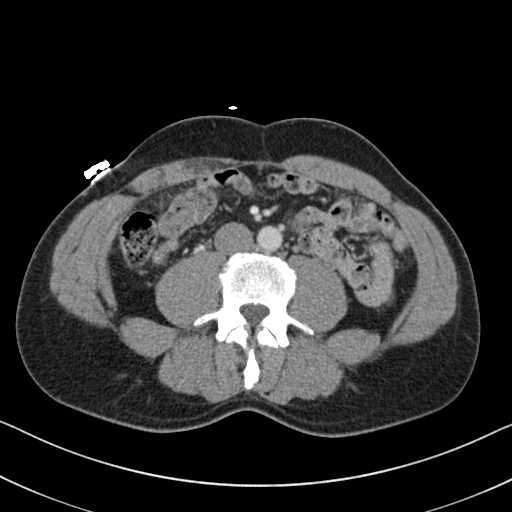
[im 53/91  soft-tissue]
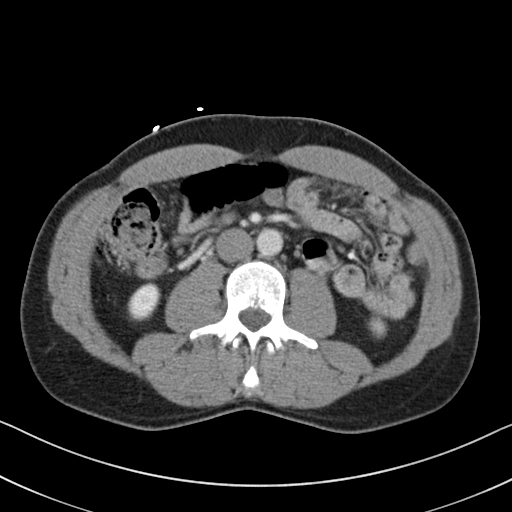
[im 59/91  soft-tissue]
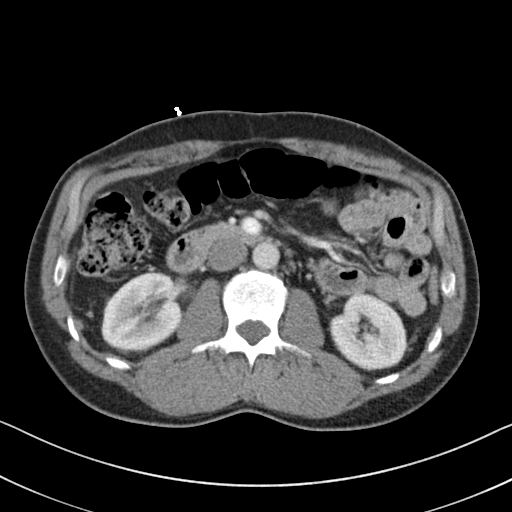
[im 59/91  bone]
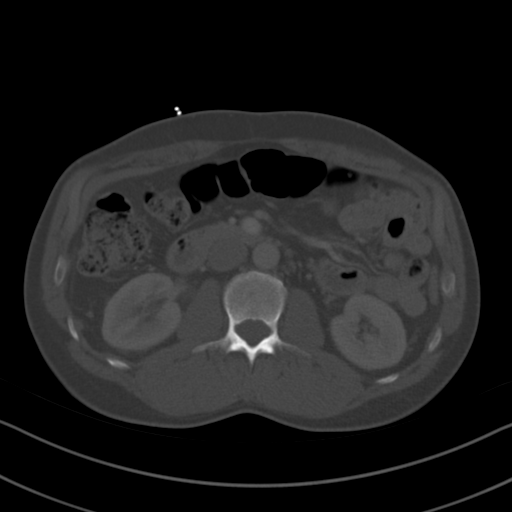
[im 64/91  soft-tissue]
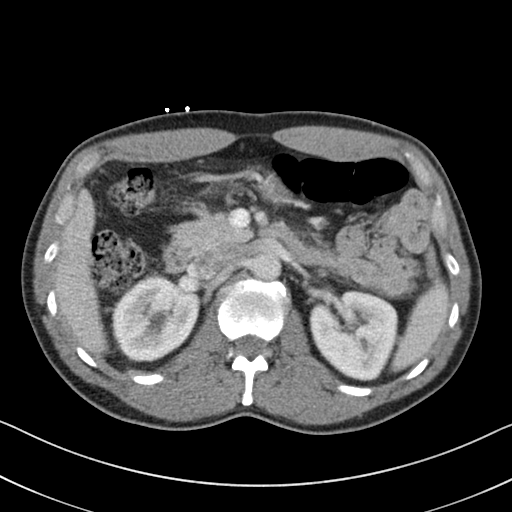
[im 69/91  soft-tissue]
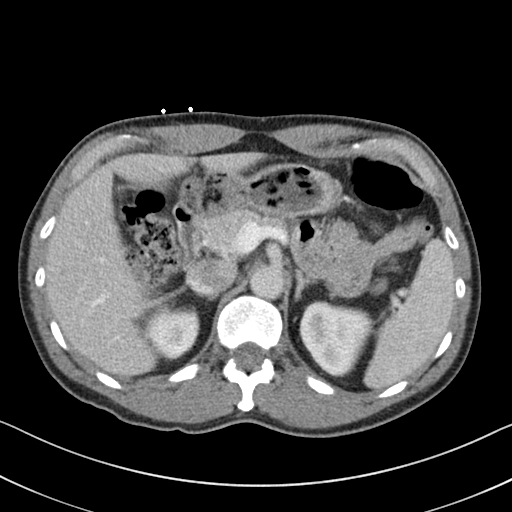
[im 80/91  soft-tissue]
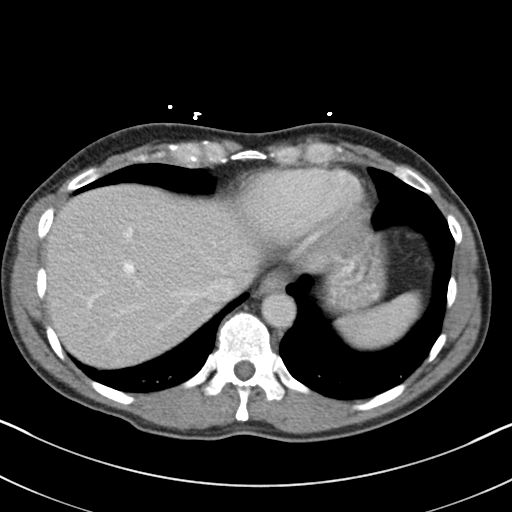
[im 85/91  soft-tissue]
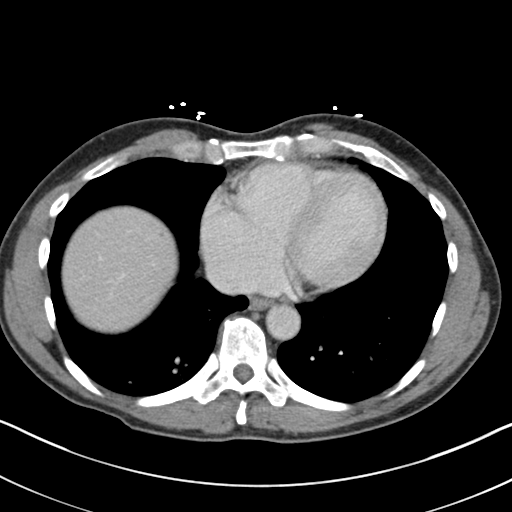

[Series 4: coronal st · coronal · 0.65mm/px · 3 of 112 slices shown]
[im 38/112  soft-tissue]
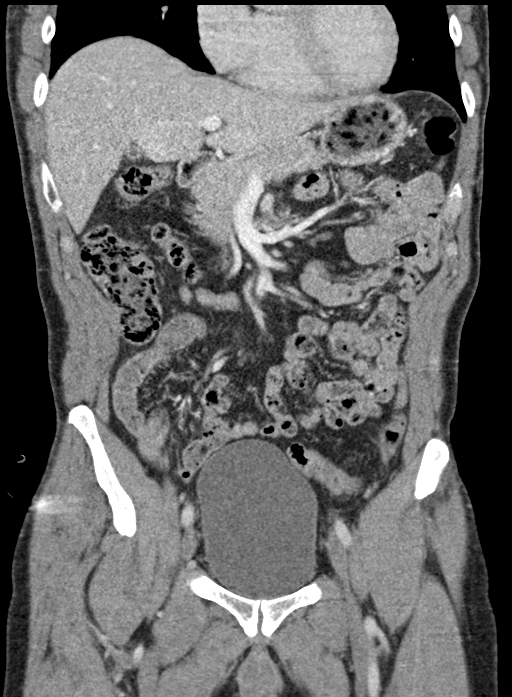
[im 50/112  soft-tissue]
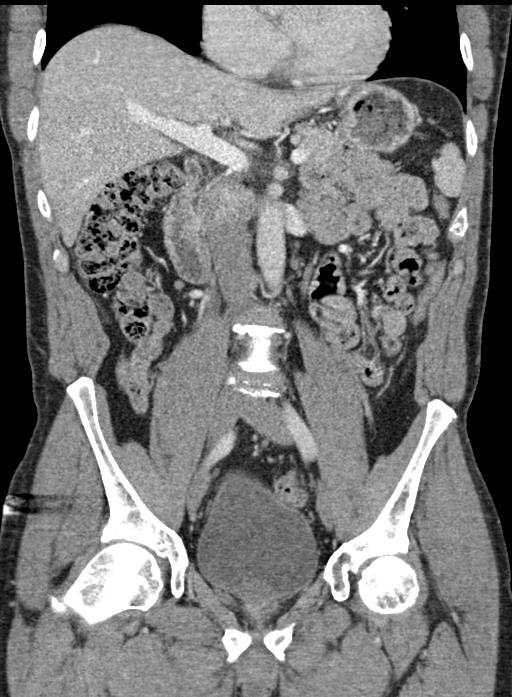
[im 62/112  soft-tissue]
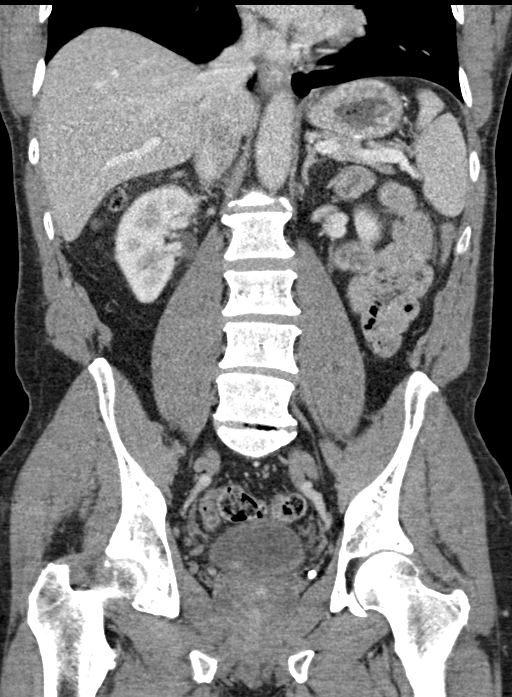

[16 of 46 positions shown; findings below may reference images not displayed]

FINDINGS: Lower chest: The visualized heart size within normal limits. No
pericardial fluid/thickening.

No hiatal hernia.

The visualized portions of the lungs are clear.

Hepatobiliary: The liver is normal in density without focal
abnormality.The main portal vein is patent. No evidence of calcified
gallstones, gallbladder wall thickening or biliary dilatation.

Pancreas: Unremarkable. No pancreatic ductal dilatation or
surrounding inflammatory changes.

Spleen: Normal in size without focal abnormality.

Adrenals/Urinary Tract: Both adrenal glands appear normal. The
kidneys and collecting system appear normal without evidence of
urinary tract calculus or hydronephrosis. Bladder is unremarkable.

Stomach/Bowel: The stomach, small bowel, and colon are normal in
appearance. No inflammatory changes, wall thickening, or obstructive
findings.The appendix is normal.

Vascular/Lymphatic: There are no enlarged mesenteric,
retroperitoneal, or pelvic lymph nodes. Mild scattered aortic
atherosclerosis seen.

Reproductive: The prostate is unremarkable.

Other: No evidence of abdominal wall mass or hernia.

Musculoskeletal: No acute or significant osseous findings.
IMPRESSION: No acute intra-abdominal or pelvic pathology to explain the
patient's symptoms.

Aortic Atherosclerosis ([id number]).

## 2020-12-30 DIAGNOSIS — S83242A Other tear of medial meniscus, current injury, left knee, initial encounter: Secondary | ICD-10-CM | POA: Diagnosis not present

## 2020-12-30 DIAGNOSIS — M1712 Unilateral primary osteoarthritis, left knee: Secondary | ICD-10-CM | POA: Diagnosis not present

## 2021-01-17 DIAGNOSIS — Y999 Unspecified external cause status: Secondary | ICD-10-CM | POA: Diagnosis not present

## 2021-01-17 DIAGNOSIS — S83232A Complex tear of medial meniscus, current injury, left knee, initial encounter: Secondary | ICD-10-CM | POA: Diagnosis not present

## 2021-01-17 DIAGNOSIS — M94262 Chondromalacia, left knee: Secondary | ICD-10-CM | POA: Diagnosis not present

## 2021-01-17 DIAGNOSIS — X58XXXA Exposure to other specified factors, initial encounter: Secondary | ICD-10-CM | POA: Diagnosis not present

## 2021-01-17 DIAGNOSIS — G8918 Other acute postprocedural pain: Secondary | ICD-10-CM | POA: Diagnosis not present

## 2021-01-17 DIAGNOSIS — M2242 Chondromalacia patellae, left knee: Secondary | ICD-10-CM | POA: Diagnosis not present

## 2021-01-24 DIAGNOSIS — M25562 Pain in left knee: Secondary | ICD-10-CM | POA: Diagnosis not present

## 2021-01-27 DIAGNOSIS — M25562 Pain in left knee: Secondary | ICD-10-CM | POA: Diagnosis not present

## 2021-01-31 DIAGNOSIS — M25562 Pain in left knee: Secondary | ICD-10-CM | POA: Diagnosis not present

## 2021-02-02 DIAGNOSIS — M25562 Pain in left knee: Secondary | ICD-10-CM | POA: Diagnosis not present

## 2021-02-06 DIAGNOSIS — M25562 Pain in left knee: Secondary | ICD-10-CM | POA: Diagnosis not present

## 2021-02-17 DIAGNOSIS — M25562 Pain in left knee: Secondary | ICD-10-CM | POA: Diagnosis not present

## 2021-05-03 DIAGNOSIS — B349 Viral infection, unspecified: Secondary | ICD-10-CM | POA: Diagnosis not present

## 2021-11-29 DIAGNOSIS — Z125 Encounter for screening for malignant neoplasm of prostate: Secondary | ICD-10-CM | POA: Diagnosis not present

## 2021-11-29 DIAGNOSIS — I700 Atherosclerosis of aorta: Secondary | ICD-10-CM | POA: Diagnosis not present

## 2021-11-29 DIAGNOSIS — Z0000 Encounter for general adult medical examination without abnormal findings: Secondary | ICD-10-CM | POA: Diagnosis not present

## 2021-11-29 DIAGNOSIS — Z13220 Encounter for screening for lipoid disorders: Secondary | ICD-10-CM | POA: Diagnosis not present

## 2021-12-06 DIAGNOSIS — C4441 Basal cell carcinoma of skin of scalp and neck: Secondary | ICD-10-CM | POA: Diagnosis not present

## 2021-12-06 DIAGNOSIS — L989 Disorder of the skin and subcutaneous tissue, unspecified: Secondary | ICD-10-CM | POA: Diagnosis not present

## 2022-01-12 DIAGNOSIS — C4441 Basal cell carcinoma of skin of scalp and neck: Secondary | ICD-10-CM | POA: Diagnosis not present

## 2022-04-11 DIAGNOSIS — L570 Actinic keratosis: Secondary | ICD-10-CM | POA: Diagnosis not present

## 2022-04-11 DIAGNOSIS — L578 Other skin changes due to chronic exposure to nonionizing radiation: Secondary | ICD-10-CM | POA: Diagnosis not present

## 2022-04-11 DIAGNOSIS — L821 Other seborrheic keratosis: Secondary | ICD-10-CM | POA: Diagnosis not present

## 2022-04-11 DIAGNOSIS — D225 Melanocytic nevi of trunk: Secondary | ICD-10-CM | POA: Diagnosis not present

## 2022-12-07 DIAGNOSIS — Z0000 Encounter for general adult medical examination without abnormal findings: Secondary | ICD-10-CM | POA: Diagnosis not present

## 2022-12-07 DIAGNOSIS — Z23 Encounter for immunization: Secondary | ICD-10-CM | POA: Diagnosis not present

## 2022-12-07 DIAGNOSIS — Z79899 Other long term (current) drug therapy: Secondary | ICD-10-CM | POA: Diagnosis not present

## 2022-12-07 DIAGNOSIS — Z125 Encounter for screening for malignant neoplasm of prostate: Secondary | ICD-10-CM | POA: Diagnosis not present

## 2022-12-26 DIAGNOSIS — Z1211 Encounter for screening for malignant neoplasm of colon: Secondary | ICD-10-CM | POA: Diagnosis not present

## 2022-12-26 DIAGNOSIS — K648 Other hemorrhoids: Secondary | ICD-10-CM | POA: Diagnosis not present

## 2022-12-26 DIAGNOSIS — K621 Rectal polyp: Secondary | ICD-10-CM | POA: Diagnosis not present

## 2023-04-15 DIAGNOSIS — M1711 Unilateral primary osteoarthritis, right knee: Secondary | ICD-10-CM | POA: Diagnosis not present

## 2023-05-23 DIAGNOSIS — D485 Neoplasm of uncertain behavior of skin: Secondary | ICD-10-CM | POA: Diagnosis not present

## 2023-05-23 DIAGNOSIS — D225 Melanocytic nevi of trunk: Secondary | ICD-10-CM | POA: Diagnosis not present

## 2023-05-23 DIAGNOSIS — L578 Other skin changes due to chronic exposure to nonionizing radiation: Secondary | ICD-10-CM | POA: Diagnosis not present

## 2023-05-23 DIAGNOSIS — L570 Actinic keratosis: Secondary | ICD-10-CM | POA: Diagnosis not present

## 2023-05-23 DIAGNOSIS — C4441 Basal cell carcinoma of skin of scalp and neck: Secondary | ICD-10-CM | POA: Diagnosis not present

## 2023-05-23 DIAGNOSIS — L814 Other melanin hyperpigmentation: Secondary | ICD-10-CM | POA: Diagnosis not present

## 2023-06-12 DIAGNOSIS — R972 Elevated prostate specific antigen [PSA]: Secondary | ICD-10-CM | POA: Diagnosis not present

## 2023-08-06 DIAGNOSIS — C4441 Basal cell carcinoma of skin of scalp and neck: Secondary | ICD-10-CM | POA: Diagnosis not present

## 2023-11-29 ENCOUNTER — Ambulatory Visit
Admission: EM | Admit: 2023-11-29 | Discharge: 2023-11-29 | Disposition: A | Discharge status: Home / Self Care | Payer: BC Managed Care – PPO | Attending: Physician Assistant | Admitting: Physician Assistant

## 2023-11-29 ENCOUNTER — Encounter: Payer: Self-pay | Admitting: *Deleted

## 2023-11-29 ENCOUNTER — Other Ambulatory Visit: Payer: Self-pay

## 2023-11-29 DIAGNOSIS — J0140 Acute pansinusitis, unspecified: Secondary | ICD-10-CM

## 2023-11-29 MED ORDER — AMOXICILLIN-POT CLAVULANATE 875-125 MG PO TABS: 1.0000 | ORAL_TABLET | Freq: Two times a day (BID) | ORAL | 0 refills | Status: AC

## 2023-11-29 NOTE — ED Provider Notes (Signed)
[facility]    CSN: [id number] Arrival date & time: 11/29/23  1647      History   Chief Complaint Chief Complaint  Patient presents with   Facial Pain    HPI Justin Tapia is a 62 y.o. male.   Patient presents today with a week and a half long history of URI symptoms.  Initially he had flulike symptoms including cough and fever but these have resolved.  He now is experiencing congestion and sinus pressure.  He reports discomfort is rated 4 on a 0-10 pain scale, described as pressure, no aggravating or alleviating factors identified.  He has been taking Alka-Seltzer plus without improvement of symptoms.  Denies any recent antibiotics or steroids.  Denies any previous sinus surgery.  Denies any known sick contacts.  He is eating and drinking normally.    History reviewed. No pertinent past medical history.  There are no active problems to display for this patient.   Past Surgical History:  Procedure Laterality Date   COSMETIC SURGERY         Home Medications    Prior to Admission medications   Medication Sig Start Date End Date Taking? Authorizing Provider  amoxicillin-clavulanate (AUGMENTIN) 875-125 MG tablet Take 1 tablet by mouth every 12 (twelve) hours. 11/29/23  Yes Nekoda Chock, Noberto Retort, PA-C    Family History History reviewed. No pertinent family history.  Social History Social History   Tobacco Use   Smoking status: Never  Vaping Use   Vaping status: Never Used  Substance Use Topics   Alcohol use: Yes    Comment: occasional   Drug use: Never     Allergies   Patient has no known allergies.   Review of Systems Review of Systems  Constitutional:  Positive for activity change. Negative for appetite change, fatigue and fever.  HENT:  Positive for congestion, sinus pressure and sinus pain. Negative for postnasal drip, sneezing and sore throat.   Respiratory:  Negative for cough and shortness of breath.   Cardiovascular:  Negative for chest  pain.  Gastrointestinal:  Negative for abdominal pain, diarrhea, nausea and vomiting.  Neurological:  Positive for headaches. Negative for dizziness and light-headedness.     Physical Exam Triage Vital Signs ED Triage Vitals  Encounter Vitals Group     BP 11/29/23 2002 (!) 145/76     Systolic BP Percentile --      Diastolic BP Percentile --      Pulse Rate 11/29/23 2002 65     Resp 11/29/23 2002 18     Temp 11/29/23 2002 98.1 F (36.7 C)     Temp Source 11/29/23 2002 Oral     SpO2 11/29/23 2002 95 %     Weight --      Height --      Head Circumference --      Peak Flow --      Pain Score 11/29/23 1959 4     Pain Loc --      Pain Education --      Exclude from Growth Chart --    No data found.  Updated Vital Signs BP (!) 145/76 (BP Location: Right Arm)   Pulse 65   Temp 98.1 F (36.7 C) (Oral)   Resp 18   SpO2 95%   Visual Acuity Right Eye Distance:   Left Eye Distance:   Bilateral Distance:    Right Eye Near:   Left Eye Near:    Bilateral Near:  Physical Exam Vitals reviewed.  Constitutional:      General: He is awake.     Appearance: Normal appearance. He is well-developed. He is not ill-appearing.     Comments: Very pleasant male appears stated age in no acute distress sitting comfortably in exam room  HENT:     Head: Normocephalic and atraumatic.     Right Ear: Ear canal and external ear normal. A middle ear effusion is present. Tympanic membrane is not erythematous or bulging.     Left Ear: Ear canal and external ear normal. A middle ear effusion is present. Tympanic membrane is not erythematous or bulging.     Nose: Congestion present.     Right Sinus: Maxillary sinus tenderness and frontal sinus tenderness present.     Left Sinus: Maxillary sinus tenderness and frontal sinus tenderness present.     Mouth/Throat:     Pharynx: Uvula midline. Posterior oropharyngeal erythema and postnasal drip present. No oropharyngeal exudate or uvula swelling.   Cardiovascular:     Rate and Rhythm: Normal rate and regular rhythm.     Heart sounds: Normal heart sounds, S1 normal and S2 normal. No murmur heard. Pulmonary:     Effort: Pulmonary effort is normal. No accessory muscle usage or respiratory distress.     Breath sounds: Normal breath sounds. No stridor. No wheezing, rhonchi or rales.     Comments: Clear to auscultation bilaterally Neurological:     Mental Status: He is alert.  Psychiatric:        Behavior: Behavior is cooperative.      UC Treatments / Results  Labs (all labs ordered are listed, but only abnormal results are displayed) Labs Reviewed - No data to display  EKG   Radiology No results found.  Procedures Procedures (including critical care time)  Medications Ordered in UC Medications - No data to display  Initial Impression / Assessment and Plan / UC Course  I have reviewed the triage vital signs and the nursing notes.  Pertinent labs & imaging results that were available during my care of the patient were reviewed by me and considered in my medical decision making (see chart for details).     Patient is well-appearing, afebrile, nontoxic, nontachycardic.  No indication for viral testing as patient has been symptomatic for over a week and this would not change management.  Given prolonged and worsening symptoms will cover for secondary bacterial sinusitis with Augmentin twice daily for 7 days.  Recommended over-the-counter medication including Mucinex, Flonase, Tylenol as well as nasal saline and sinus rinses for additional symptom relief.  Discussed that if anything worsens or changes he is to return for reevaluation.  Strict return precautions given.  Final Clinical Impressions(s) / UC Diagnoses   Final diagnoses:  Acute non-recurrent pansinusitis     Discharge Instructions      Start Augmentin twice daily for 7 days to cover for sinus infection.  I also recommend over-the-counter medications  including Tylenol, Mucinex, Flonase.  You can also use nasal saline and sinus rinses for additional symptom relief.  Make sure you rest and drink plenty of fluid.  If your symptoms are not improving within a few days please return for reevaluation.  If anything worsens and you have high fever, cough, chest pain, shortness of breath, nausea, vomiting you should be seen immediately.     ED Prescriptions     Medication Sig Dispense Auth. Provider   amoxicillin-clavulanate (AUGMENTIN) 875-125 MG tablet Take 1 tablet by mouth every  12 (twelve) hours. 14 tablet Charon Smedberg, Noberto Retort, PA-C      PDMP not reviewed this encounter.   Jeani Hawking, PA-C 11/29/23 2028

## 2023-11-29 NOTE — Discharge Instructions (Signed)
Start Augmentin twice daily for 7 days to cover for sinus infection.  I also recommend over-the-counter medications including Tylenol, Mucinex, Flonase.  You can also use nasal saline and sinus rinses for additional symptom relief.  Make sure you rest and drink plenty of fluid.  If your symptoms are not improving within a few days please return for reevaluation.  If anything worsens and you have high fever, cough, chest pain, shortness of breath, nausea, vomiting you should be seen immediately.

## 2023-11-29 NOTE — ED Triage Notes (Signed)
Recent "flu-like symptoms". Feeling better since Saturday but now has facial/sinus pain

## 2023-12-09 DIAGNOSIS — D485 Neoplasm of uncertain behavior of skin: Secondary | ICD-10-CM | POA: Diagnosis not present

## 2023-12-09 DIAGNOSIS — L738 Other specified follicular disorders: Secondary | ICD-10-CM | POA: Diagnosis not present

## 2023-12-09 DIAGNOSIS — L821 Other seborrheic keratosis: Secondary | ICD-10-CM | POA: Diagnosis not present

## 2023-12-09 DIAGNOSIS — C44519 Basal cell carcinoma of skin of other part of trunk: Secondary | ICD-10-CM | POA: Diagnosis not present

## 2023-12-09 DIAGNOSIS — Z08 Encounter for follow-up examination after completed treatment for malignant neoplasm: Secondary | ICD-10-CM | POA: Diagnosis not present

## 2023-12-09 DIAGNOSIS — Z85828 Personal history of other malignant neoplasm of skin: Secondary | ICD-10-CM | POA: Diagnosis not present

## 2023-12-13 DIAGNOSIS — Z125 Encounter for screening for malignant neoplasm of prostate: Secondary | ICD-10-CM | POA: Diagnosis not present

## 2023-12-13 DIAGNOSIS — Z13220 Encounter for screening for lipoid disorders: Secondary | ICD-10-CM | POA: Diagnosis not present

## 2023-12-13 DIAGNOSIS — Z79899 Other long term (current) drug therapy: Secondary | ICD-10-CM | POA: Diagnosis not present

## 2023-12-13 DIAGNOSIS — Z0000 Encounter for general adult medical examination without abnormal findings: Secondary | ICD-10-CM | POA: Diagnosis not present

## 2023-12-24 DIAGNOSIS — Z03818 Encounter for observation for suspected exposure to other biological agents ruled out: Secondary | ICD-10-CM | POA: Diagnosis not present

## 2023-12-24 DIAGNOSIS — J988 Other specified respiratory disorders: Secondary | ICD-10-CM | POA: Diagnosis not present

## 2023-12-24 DIAGNOSIS — Z20822 Contact with and (suspected) exposure to covid-19: Secondary | ICD-10-CM | POA: Diagnosis not present

## 2023-12-24 DIAGNOSIS — R509 Fever, unspecified: Secondary | ICD-10-CM | POA: Diagnosis not present

## 2023-12-24 DIAGNOSIS — R053 Chronic cough: Secondary | ICD-10-CM | POA: Diagnosis not present

## 2024-01-28 DIAGNOSIS — C44519 Basal cell carcinoma of skin of other part of trunk: Secondary | ICD-10-CM | POA: Diagnosis not present

## 2024-03-19 DIAGNOSIS — D3131 Benign neoplasm of right choroid: Secondary | ICD-10-CM | POA: Diagnosis not present

## 2024-05-06 DIAGNOSIS — D3132 Benign neoplasm of left choroid: Secondary | ICD-10-CM | POA: Diagnosis not present

## 2024-05-06 DIAGNOSIS — H43812 Vitreous degeneration, left eye: Secondary | ICD-10-CM | POA: Diagnosis not present

## 2024-07-08 DIAGNOSIS — D3132 Benign neoplasm of left choroid: Secondary | ICD-10-CM | POA: Diagnosis not present

## 2024-07-08 DIAGNOSIS — H43812 Vitreous degeneration, left eye: Secondary | ICD-10-CM | POA: Diagnosis not present

## 2024-07-30 DIAGNOSIS — M25561 Pain in right knee: Secondary | ICD-10-CM | POA: Diagnosis not present
